# Patient Record
Sex: Male | Born: 1990 | Race: White | Hispanic: No | Marital: Single | Smoking: Never smoker
Health system: Southern US, Community
[De-identification: ages and names within clinical notes are randomized; demographics above are authoritative.]

---

## 2020-03-09 ENCOUNTER — Emergency Department (INDEPENDENT_AMBULATORY_CARE_PROVIDER_SITE_OTHER)
Admission: EM | Admit: 2020-03-09 | Discharge: 2020-03-09 | Disposition: A | Discharge status: Home / Self Care | Payer: 59 | Source: Home / Self Care

## 2020-03-09 ENCOUNTER — Other Ambulatory Visit: Payer: Self-pay

## 2020-03-09 ENCOUNTER — Emergency Department (INDEPENDENT_AMBULATORY_CARE_PROVIDER_SITE_OTHER): Payer: 59

## 2020-03-09 DIAGNOSIS — J189 Pneumonia, unspecified organism: Secondary | ICD-10-CM | POA: Diagnosis not present

## 2020-03-09 MED ORDER — BENZONATATE 100 MG PO CAPS: 100.0000 mg | ORAL_CAPSULE | Freq: Four times a day (QID) | ORAL | 0 refills | Status: AC | PRN

## 2020-03-09 MED ORDER — DOXYCYCLINE HYCLATE 100 MG PO TABS: 100.0000 mg | ORAL_TABLET | Freq: Two times a day (BID) | ORAL | 0 refills | Status: AC

## 2020-03-09 NOTE — Discharge Instructions (Signed)
Return if any problems.

## 2020-03-09 NOTE — ED Provider Notes (Signed)
[facility]    CSN: [id number] Arrival date & time: 03/09/20  1038      History   Chief Complaint Chief Complaint  Patient presents with  . Sore Throat    HPI Jared Payne is a 29 y.o. male.   The history is provided by the patient. No language interpreter was used.  Cough Cough characteristics:  Productive Sputum characteristics:  Nondescript Severity:  Moderate Duration:  10 days Timing:  Constant Progression:  Worsening Chronicity:  New Smoker: no   Context: upper respiratory infection   Context: not animal exposure   Relieved by:  Nothing Worsened by:  Nothing Ineffective treatments:  None tried Associated symptoms: no shortness of breath and no sinus congestion     History reviewed. No pertinent past medical history.  There are no problems to display for this patient.   History reviewed. No pertinent surgical history.     Home Medications    Prior to Admission medications   Medication Sig Start Date End Date Taking? Authorizing Provider  benzonatate (TESSALON PERLES) 100 MG capsule Take 1 capsule (100 mg total) by mouth every 6 (six) hours as needed for cough. 03/09/20 03/09/21  Fransico Meadow, PA-C  doxycycline (VIBRA-TABS) 100 MG tablet Take 1 tablet (100 mg total) by mouth 2 (two) times daily. 03/09/20   Fransico Meadow, PA-C    Family History Family History  Problem Relation Age of Onset  . Healthy Mother   . Healthy Father     Social History Social History   Tobacco Use  . Smoking status: Never Smoker  . Smokeless tobacco: Never Used  Substance Use Topics  . Alcohol use: Not Currently  . Drug use: Not on file     Allergies   Patient has no known allergies.   Review of Systems Review of Systems  Respiratory: Positive for cough. Negative for shortness of breath.   All other systems reviewed and are negative.    Physical Exam Triage Vital Signs ED Triage Vitals  Enc Vitals Group     BP 03/09/20 1105 130/74   Pulse Rate 03/09/20 1105 62     Resp 03/09/20 1105 18     Temp 03/09/20 1105 98.4 F (36.9 C)     Temp Source 03/09/20 1105 Oral     SpO2 03/09/20 1105 100 %     Weight --      Height --      Head Circumference --      Peak Flow --      Pain Score 03/09/20 1104 0     Pain Loc --      Pain Edu? --      Excl. in [place]? --    No data found.  Updated Vital Signs BP 130/74 (BP Location: Left Arm)   Pulse 62   Temp 98.4 F (36.9 C) (Oral)   Resp 18   SpO2 100%   Visual Acuity Right Eye Distance:   Left Eye Distance:   Bilateral Distance:    Right Eye Near:   Left Eye Near:    Bilateral Near:     Physical Exam Vitals and nursing note reviewed.  Constitutional:      Appearance: He is well-developed.  HENT:     Head: Normocephalic and atraumatic.  Eyes:     Conjunctiva/sclera: Conjunctivae normal.  Cardiovascular:     Rate and Rhythm: Normal rate and regular rhythm.     Heart sounds: No murmur.  Pulmonary:  Effort: Pulmonary effort is normal. No respiratory distress.     Breath sounds: Rhonchi present.  Abdominal:     Palpations: Abdomen is soft.     Tenderness: There is no abdominal tenderness.  Musculoskeletal:     Cervical back: Neck supple.  Skin:    General: Skin is warm and dry.  Neurological:     Mental Status: He is alert.      UC Treatments / Results  Labs (all labs ordered are listed, but only abnormal results are displayed) Labs Reviewed - No data to display  EKG   Radiology DG Chest 2 View  Result Date: 03/09/2020 CLINICAL DATA:  Cough EXAM: CHEST - 2 VIEW COMPARISON:  None. FINDINGS: The heart size and mediastinal contours are within normal limits. Suspected mild patchy increased density overlying the lower lungs on the frontal view, greater on the left. No pleural effusion. The visualized skeletal structures are unremarkable. IMPRESSION: Question of mild patchy increased density in the left greater than right lower lungs. Could reflect  atypical pneumonia. Electronically Signed   By: Guadlupe Spanish M.D.   On: 03/09/2020 11:45    Procedures Procedures (including critical care time)  Medications Ordered in UC Medications - No data to display  Initial Impression / Assessment and Plan / UC Course  I have reviewed the triage vital signs and the nursing notes.  Pertinent labs & imaging results that were available during my care of the patient were reviewed by me and considered in my medical decision making (see chart for details).     MDM:  Chest xray shows probable early pneumonia.  Pt given rx for doxycycline and tessalon.  Pt advised recheck if symptoms worsen or change. Final Clinical Impressions(s) / UC Diagnoses   Final diagnoses:  Pneumonia of both lungs due to infectious organism, unspecified part of lung     Discharge Instructions     Return if any problems.   ED Prescriptions    Medication Sig Dispense Auth. Provider   doxycycline (VIBRA-TABS) 100 MG tablet Take 1 tablet (100 mg total) by mouth 2 (two) times daily. 20 tablet Tanairi Cypert K, PA-C   benzonatate (TESSALON PERLES) 100 MG capsule Take 1 capsule (100 mg total) by mouth every 6 (six) hours as needed for cough. 30 capsule Elson Areas, [place]     PDMP not reviewed this encounter.  An After Visit Summary was printed and given to the patient.    Elson Areas, [place] 03/09/20 1210

## 2020-03-09 NOTE — ED Triage Notes (Signed)
Patient presents to Urgent Care with complaints of sore throat, cough, and chills (denies fever) since about 8 days ago. Patient reports he takes mucinex and it helps him get through the day.

## 2021-04-14 IMAGING — DX DG CHEST 2V
2 series · 2 of 2 positions shown · non-contrast
Comparison: None.

CLINICAL DATA: Cough

EXAM:
CHEST - 2 VIEW

[chest pa]
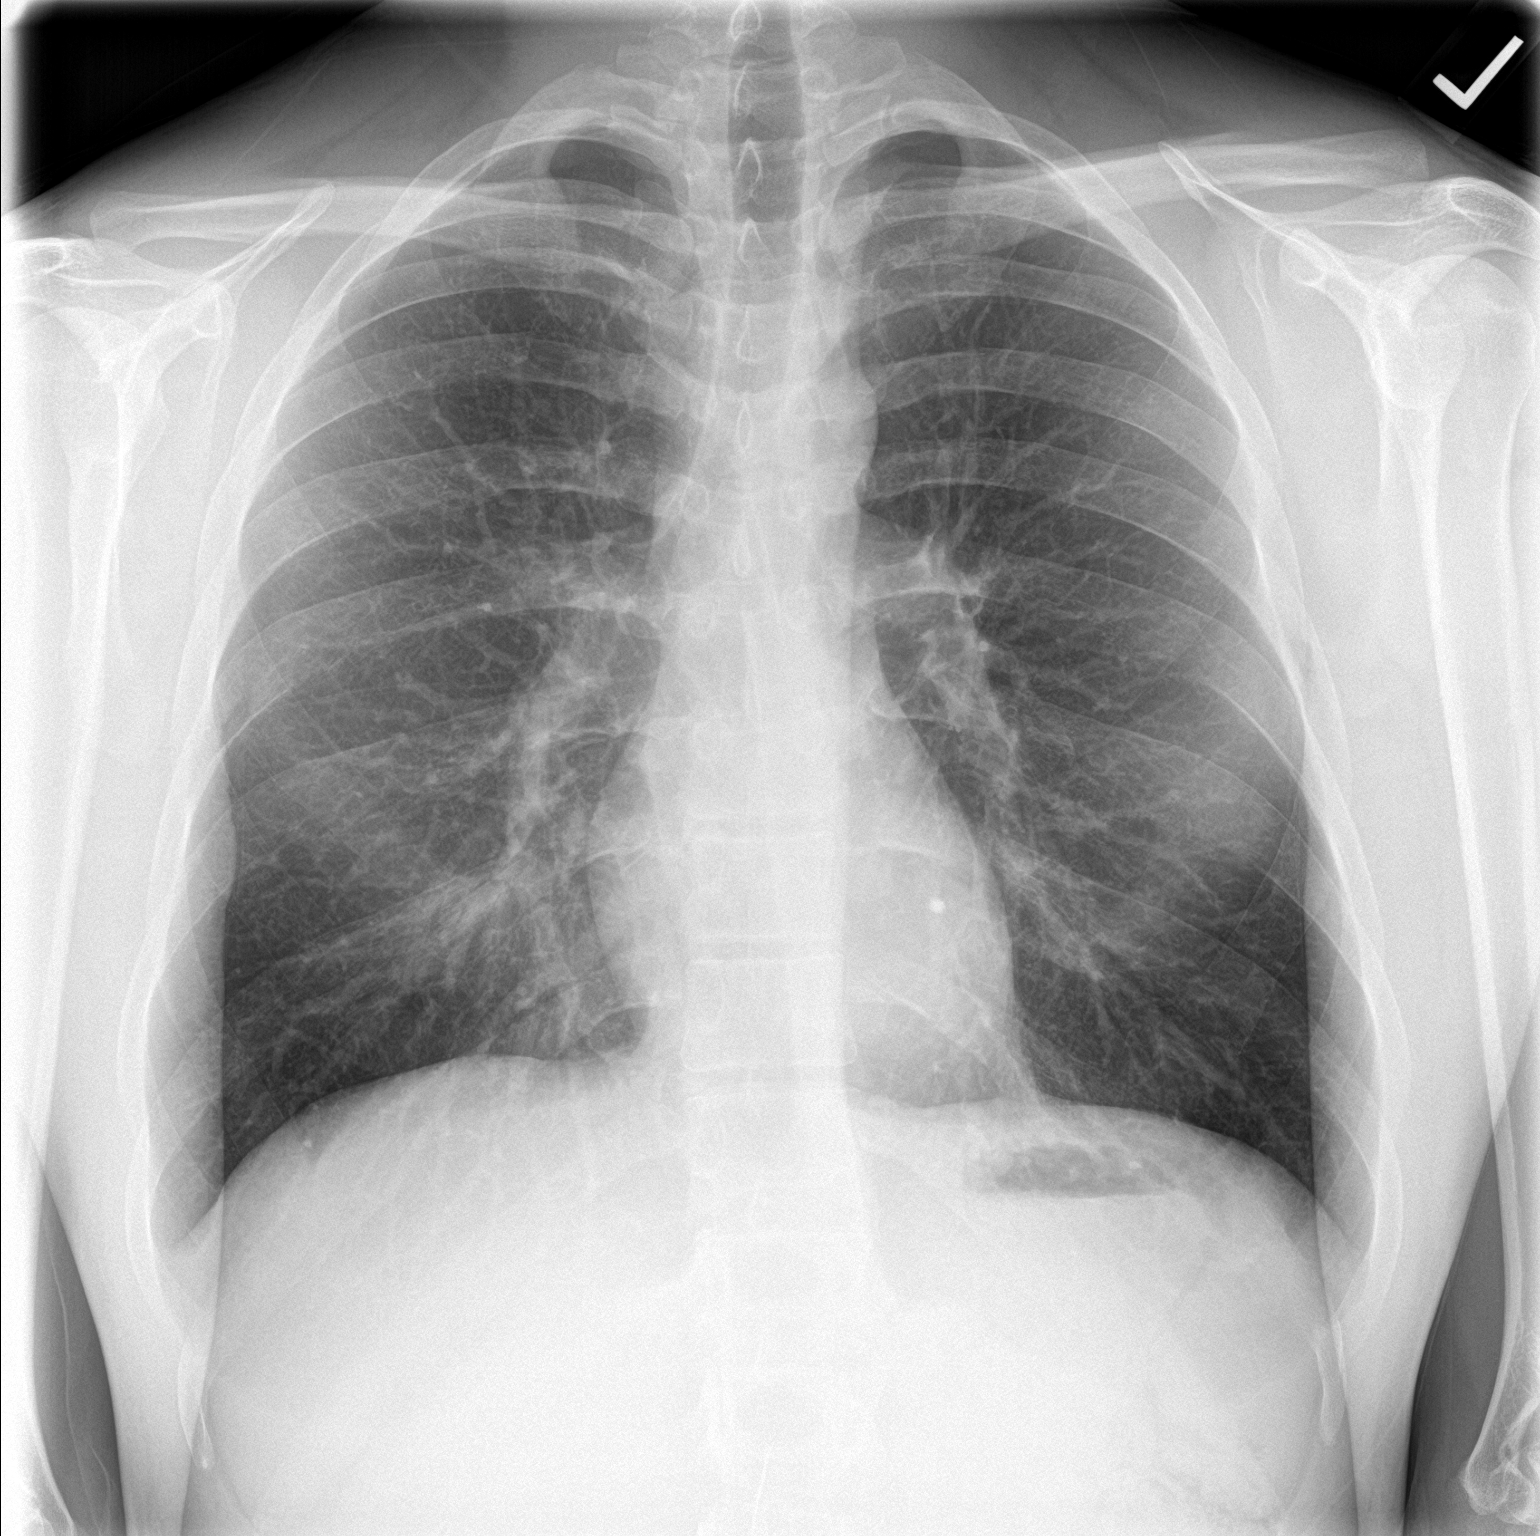

[chest lat]
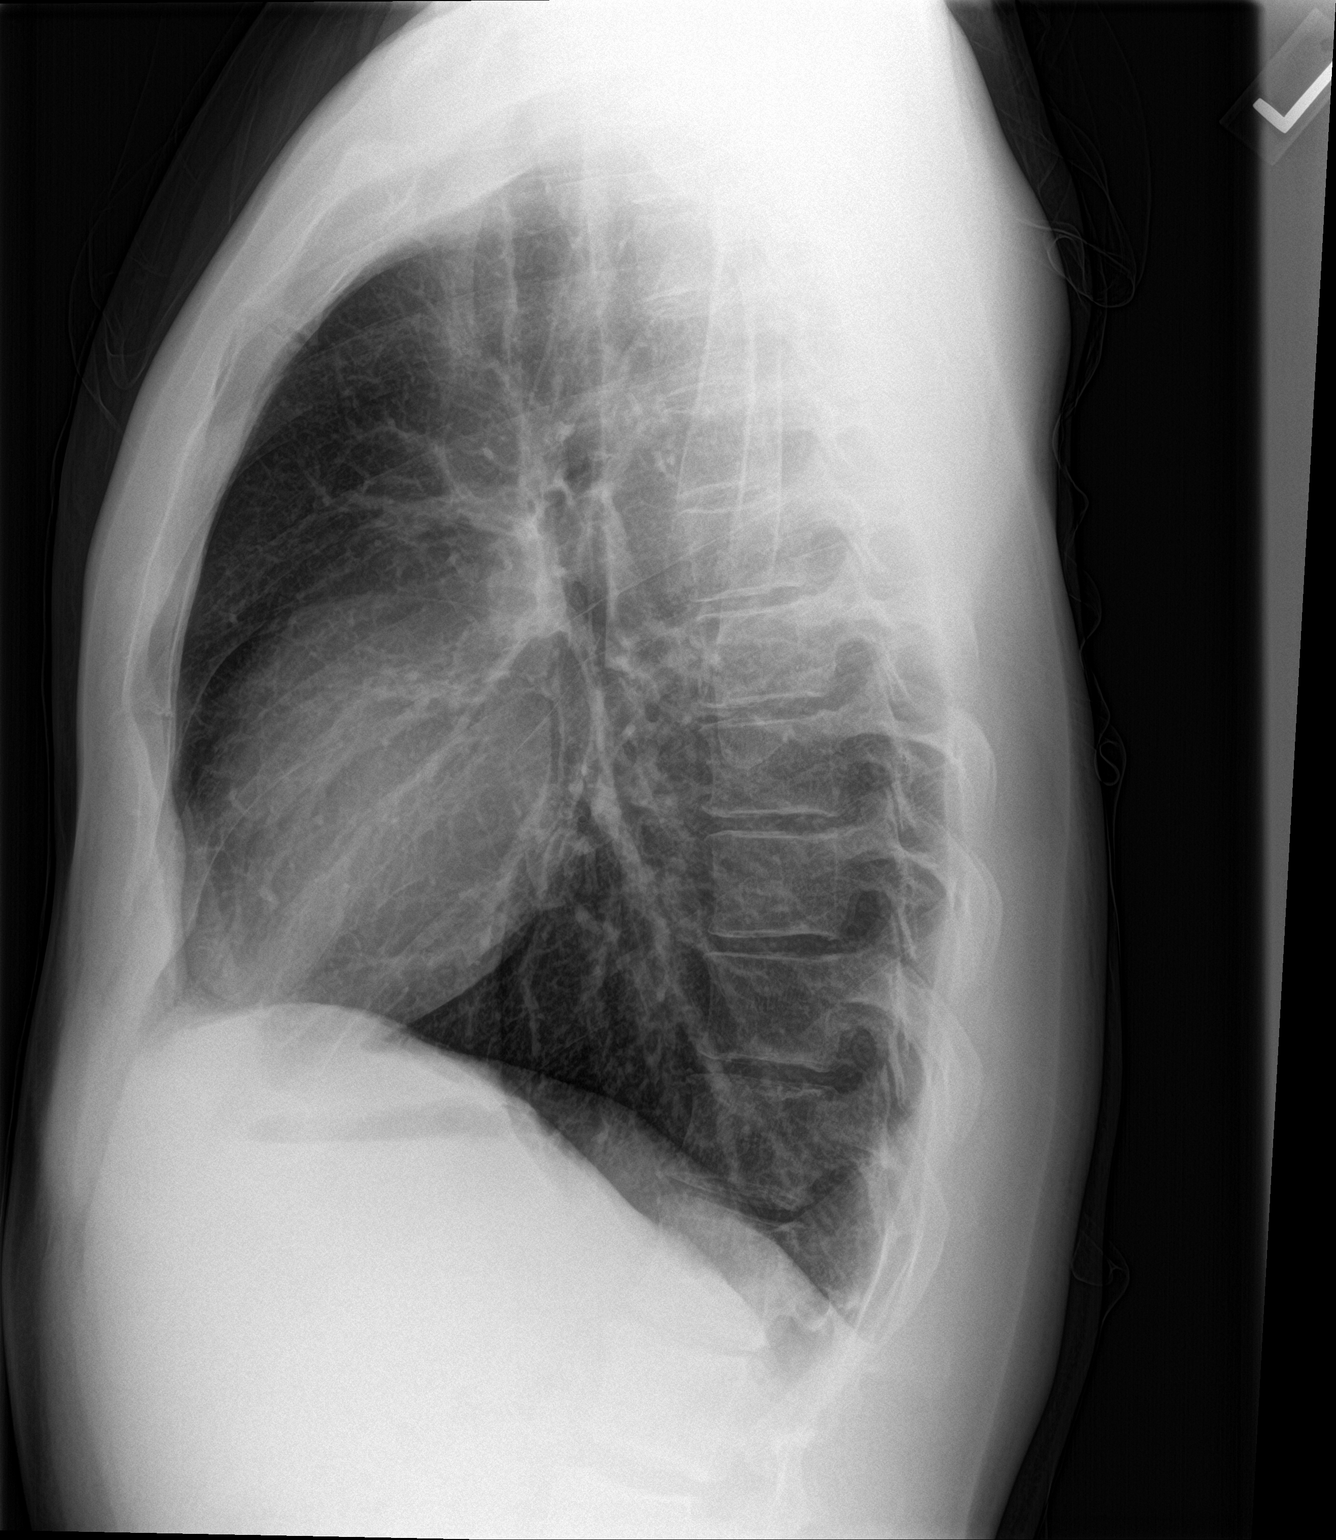

[2 of 2 positions shown; findings below may reference images not displayed]

FINDINGS: The heart size and mediastinal contours are within normal limits.
Suspected mild patchy increased density overlying the lower lungs on
the frontal view, greater on the left. No pleural effusion. The
visualized skeletal structures are unremarkable.
IMPRESSION: Question of mild patchy increased density in the left greater than
right lower lungs. Could reflect atypical pneumonia.
